# Patient Record
Sex: Male | Born: 1945 | Race: White | Hispanic: No | Marital: Single
Health system: Southern US, Community
[De-identification: ages and names within clinical notes are randomized; demographics above are authoritative.]

---

## 2003-02-19 ENCOUNTER — Encounter (INDEPENDENT_AMBULATORY_CARE_PROVIDER_SITE_OTHER): Payer: Self-pay | Admitting: Specialist

## 2003-02-19 ENCOUNTER — Encounter: Payer: Self-pay | Admitting: Emergency Medicine

## 2003-02-20 ENCOUNTER — Inpatient Hospital Stay (HOSPITAL_COMMUNITY): Admission: EM | Admit: 2003-02-20 | Discharge: 2003-02-27 | Payer: Self-pay | Admitting: Emergency Medicine

## 2003-03-24 ENCOUNTER — Ambulatory Visit (HOSPITAL_COMMUNITY): Admission: RE | Admit: 2003-03-24 | Discharge: 2003-03-24 | Payer: Self-pay | Admitting: Gastroenterology

## 2003-03-24 ENCOUNTER — Encounter (INDEPENDENT_AMBULATORY_CARE_PROVIDER_SITE_OTHER): Payer: Self-pay | Admitting: Specialist

## 2003-09-24 ENCOUNTER — Encounter: Payer: Self-pay | Admitting: Oncology

## 2003-09-24 ENCOUNTER — Ambulatory Visit (HOSPITAL_COMMUNITY): Admission: RE | Admit: 2003-09-24 | Discharge: 2003-09-24 | Payer: Self-pay | Admitting: Oncology

## 2003-10-25 ENCOUNTER — Inpatient Hospital Stay (HOSPITAL_COMMUNITY): Admission: RE | Admit: 2003-10-25 | Discharge: 2003-10-30 | Payer: Self-pay | Admitting: Surgery

## 2003-10-25 ENCOUNTER — Encounter (INDEPENDENT_AMBULATORY_CARE_PROVIDER_SITE_OTHER): Payer: Self-pay

## 2003-11-04 ENCOUNTER — Inpatient Hospital Stay (HOSPITAL_COMMUNITY): Admission: RE | Admit: 2003-11-04 | Discharge: 2003-11-12 | Payer: Self-pay | Admitting: Surgery

## 2003-11-15 ENCOUNTER — Ambulatory Visit (HOSPITAL_COMMUNITY): Admission: RE | Admit: 2003-11-15 | Discharge: 2003-11-15 | Payer: Self-pay | Admitting: Surgery

## 2004-02-28 IMAGING — CT CT PELVIS W/ CM
2 of 13 series · 4 of 32 positions shown, 7 images · IV contrast (omnipaque)
Comparison: none

CLINICAL DATA: Status post sigmoid colon resection 10/25/03.  Now with abdominal pain, fever, diarrhea and elevated white count.
 CT ABDOMEN WITH CONTRAST
 Multi detector helical CT imaging was performed through the abdomen and pelvis following dilute oral contrast and 150 cc Omnipaque 300 IV contrast.  
 There is a tiny low density lesion within the dome of the liver, likely a small cyst although this is too small to characterize.  Otherwise, the liver, spleen, pancreas, adrenals and kidneys are unremarkable.  
 Within the mid and lower abdomen anteriorly, there is a large gas collection.  This extends down into the pelvis and abuts the sigmoid colon within the pelvis.  In the region of the sigmoid colon, there is a small amount of fluid.  The air collection in the abdomen measures 12.6 x 7.6 cm on image #60.  This is most compatible with a postoperative abscess containing mainly gas.  
 IMPRESSION
 Large predominant gas collection within the mid and lower abdomen extending into the pelvis along the sigmoid colon.  There is a small amount of fluid in this predominant air collection seen in the pelvis along the sigmoid colon.  This is most compatible with postoperative abscess.  
 CT PELVIS WITH CONTRAST
 Again noted is the air and fluid collection as previously described above.  Stranding is noted around the sigmoid colon and around this fluid collection inferiorly compatible with abscess.  No free fluid in the pelvis.  
 Stranding along the sigmoid colon.  This large air-fluid collection described above does abut the sigmoid colon, most likely representing a postoperative abscess.

[Series 3: — · axial · 0.74mm/px · z∈[+994,+1024]mm · 2 of 20 slices shown, 5 images (1 of 2)]
[im 7/20  soft-tissue]
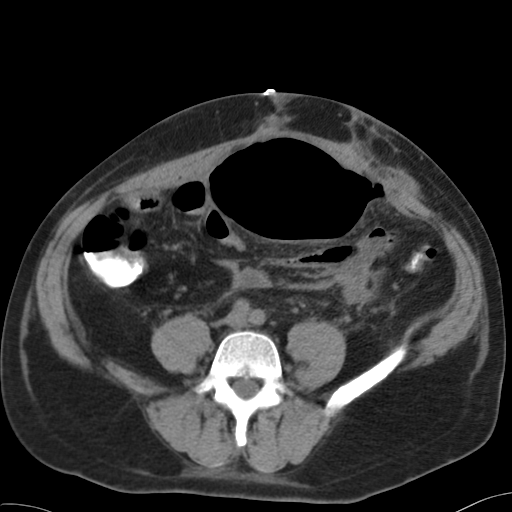
[im 7/20  lung]
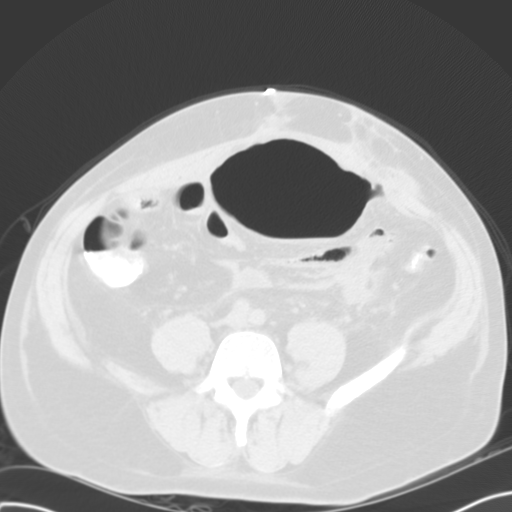
[im 7/20  bone]
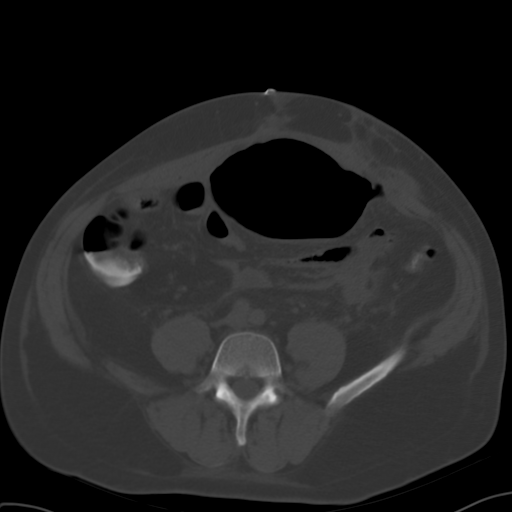
[im 13/20  soft-tissue]
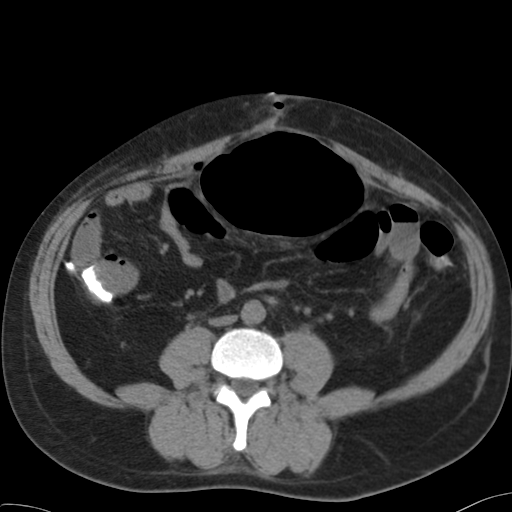
[im 13/20  lung]
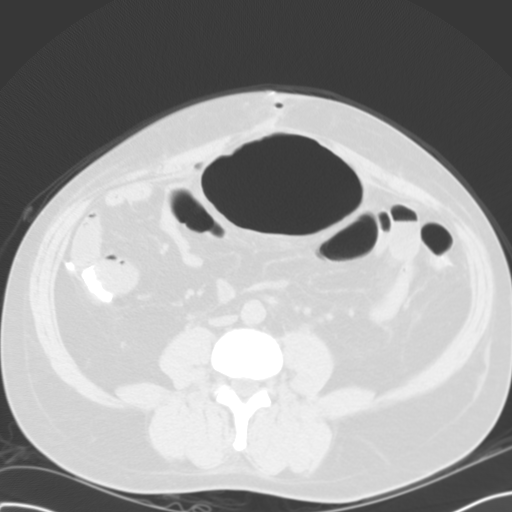

[Series 4: — · axial · 0.74mm/px · z∈[+932,+956]mm · 2 of 17 slices shown (2 of 2)]
[im 6/17  soft-tissue]
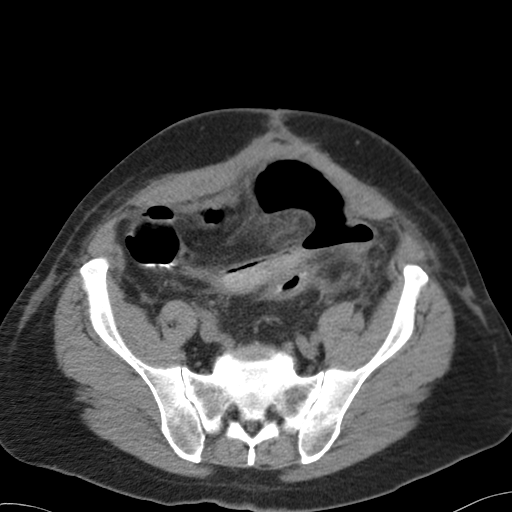
[im 11/17  soft-tissue]
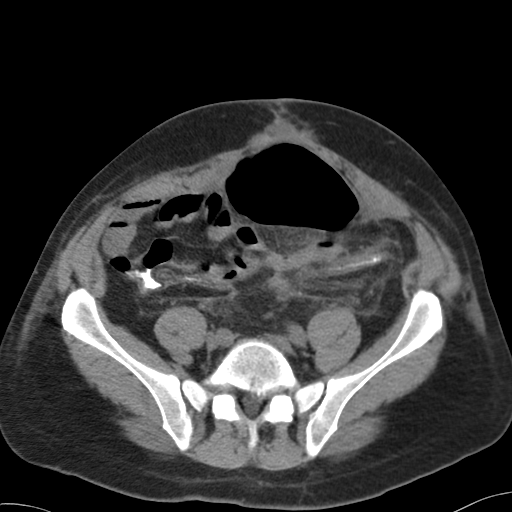

[4 of 32 positions shown; findings below may reference images not displayed]

## 2004-05-09 ENCOUNTER — Ambulatory Visit (HOSPITAL_COMMUNITY): Admission: RE | Admit: 2004-05-09 | Discharge: 2004-05-09 | Payer: Self-pay | Admitting: Oncology

## 2004-11-27 ENCOUNTER — Ambulatory Visit: Payer: Self-pay | Admitting: Oncology

## 2004-11-29 ENCOUNTER — Ambulatory Visit (HOSPITAL_COMMUNITY): Admission: RE | Admit: 2004-11-29 | Discharge: 2004-11-29 | Payer: Self-pay | Admitting: Oncology

## 2005-05-23 ENCOUNTER — Ambulatory Visit: Payer: Self-pay | Admitting: Oncology

## 2005-11-20 ENCOUNTER — Ambulatory Visit: Payer: Self-pay | Admitting: Oncology

## 2005-11-27 ENCOUNTER — Ambulatory Visit (HOSPITAL_COMMUNITY): Admission: RE | Admit: 2005-11-27 | Discharge: 2005-11-27 | Payer: Self-pay | Admitting: Oncology

## 2006-05-18 ENCOUNTER — Ambulatory Visit: Payer: Self-pay | Admitting: Oncology

## 2006-09-17 ENCOUNTER — Encounter: Admission: RE | Admit: 2006-09-17 | Discharge: 2006-09-17 | Payer: Self-pay | Admitting: Internal Medicine

## 2006-11-26 ENCOUNTER — Ambulatory Visit: Payer: Self-pay | Admitting: Oncology

## 2007-05-27 ENCOUNTER — Ambulatory Visit: Payer: Self-pay | Admitting: Oncology

## 2008-02-03 ENCOUNTER — Ambulatory Visit: Payer: Self-pay | Admitting: Oncology

## 2008-02-05 LAB — CEA: CEA: 0.7 ng/mL (ref 0.0–5.0)

## 2008-03-24 ENCOUNTER — Emergency Department (HOSPITAL_COMMUNITY): Admission: EM | Admit: 2008-03-24 | Discharge: 2008-03-24 | Payer: Self-pay | Admitting: Emergency Medicine

## 2010-02-16 ENCOUNTER — Inpatient Hospital Stay (HOSPITAL_COMMUNITY): Admission: EM | Admit: 2010-02-16 | Discharge: 2010-02-19 | Payer: Self-pay | Admitting: Emergency Medicine

## 2010-02-16 ENCOUNTER — Ambulatory Visit: Payer: Self-pay | Admitting: Internal Medicine

## 2010-02-17 ENCOUNTER — Encounter (INDEPENDENT_AMBULATORY_CARE_PROVIDER_SITE_OTHER): Payer: Self-pay | Admitting: Internal Medicine

## 2011-03-14 LAB — BASIC METABOLIC PANEL
BUN: 17 mg/dL (ref 6–23)
BUN: 17 mg/dL (ref 6–23)
BUN: 46 mg/dL — ABNORMAL HIGH (ref 6–23)
CO2: 22 mEq/L (ref 19–32)
CO2: 23 mEq/L (ref 19–32)
CO2: 29 mEq/L (ref 19–32)
Calcium: 7.9 mg/dL — ABNORMAL LOW (ref 8.4–10.5)
Chloride: 101 mEq/L (ref 96–112)
Chloride: 104 mEq/L (ref 96–112)
Chloride: 94 mEq/L — ABNORMAL LOW (ref 96–112)
Chloride: 99 mEq/L (ref 96–112)
Creatinine, Ser: 0.93 mg/dL (ref 0.4–1.5)
Creatinine, Ser: 2.27 mg/dL — ABNORMAL HIGH (ref 0.4–1.5)
GFR calc Af Amer: 36 mL/min — ABNORMAL LOW (ref >60)
GFR calc Af Amer: >60 mL/min (ref >60)
GFR calc non Af Amer: 29 mL/min — ABNORMAL LOW (ref >60)
Glucose, Bld: 107 mg/dL — ABNORMAL HIGH (ref 70–99)
Glucose, Bld: 232 mg/dL — ABNORMAL HIGH (ref 70–99)
Potassium: 3.3 mEq/L — ABNORMAL LOW (ref 3.5–5.1)
Potassium: 3.4 mEq/L — ABNORMAL LOW (ref 3.5–5.1)
Sodium: 129 mEq/L — ABNORMAL LOW (ref 135–145)
Sodium: 135 mEq/L (ref 135–145)

## 2011-03-14 LAB — CBC
HCT: 37.5 % — ABNORMAL LOW (ref 39.0–52.0)
HCT: 40.6 % (ref 39.0–52.0)
Hemoglobin: 12.5 g/dL — ABNORMAL LOW (ref 13.0–17.0)
Hemoglobin: 12.8 g/dL — ABNORMAL LOW (ref 13.0–17.0)
Hemoglobin: 14 g/dL (ref 13.0–17.0)
MCHC: 34.5 g/dL (ref 30.0–36.0)
MCHC: 34.7 g/dL (ref 30.0–36.0)
MCV: 91.6 fL (ref 78.0–100.0)
MCV: 92.2 fL (ref 78.0–100.0)
MCV: 92.5 fL (ref 78.0–100.0)
Platelets: 218 10*3/uL (ref 150–400)
Platelets: 224 10*3/uL (ref 150–400)
RBC: 4 MIL/uL — ABNORMAL LOW (ref 4.22–5.81)
RBC: 4.39 MIL/uL (ref 4.22–5.81)
RDW: 13.8 % (ref 11.5–15.5)
WBC: 10.6 10*3/uL — ABNORMAL HIGH (ref 4.0–10.5)
WBC: 13.2 10*3/uL — ABNORMAL HIGH (ref 4.0–10.5)

## 2011-03-14 LAB — DIFFERENTIAL
Basophils Absolute: 0 10*3/uL (ref 0.0–0.1)
Basophils Relative: 0 % (ref 0–1)
Eosinophils Absolute: 0 10*3/uL (ref 0.0–0.7)
Eosinophils Relative: 0 % (ref 0–5)
Lymphocytes Relative: 3 % — ABNORMAL LOW (ref 12–46)
Lymphs Abs: 0.3 10*3/uL — ABNORMAL LOW (ref 0.7–4.0)
Monocytes Absolute: 0.2 10*3/uL (ref 0.1–1.0)
Monocytes Relative: 2 % — ABNORMAL LOW (ref 3–12)
Neutro Abs: 12.6 10*3/uL — ABNORMAL HIGH (ref 1.7–7.7)
Neutrophils Relative %: 96 % — ABNORMAL HIGH (ref 43–77)

## 2011-03-14 LAB — CULTURE, BLOOD (ROUTINE X 2): Culture: NO GROWTH

## 2011-03-14 LAB — TSH: TSH: 0.047 u[IU]/mL — ABNORMAL LOW (ref 0.350–4.500)

## 2011-03-14 LAB — GLUCOSE, CAPILLARY: Glucose-Capillary: 192 mg/dL — ABNORMAL HIGH (ref 70–99)

## 2011-05-11 NOTE — Discharge Summary (Signed)
NAME:  Garrett Parker, Garrett Parker                         ACCOUNT NO.:  [id number]   MEDICAL RECORD NO.:  [id number]                   PATIENT TYPE:  INP   LOCATION:  0472                                 FACILITY:  [facility]   PHYSICIAN:  Currie Paris, M.D.           DATE OF BIRTH:  March 16, 1946   DATE OF ADMISSION:  11/04/2003  DATE OF DISCHARGE:  11/12/2003                                 DISCHARGE SUMMARY   FINAL DIAGNOSES:  1. Pelvic abscess status post takedown of sigmoid colostomy.  2. Malnutrition.   HISTORY:  Mr. Franchi is a 65 year old gentleman recently discharged from  this hospital following takedown of his sigmoid colon colostomy.  He  underwent that surgery on November 1 and was discharged on October 30, 2003  doing well.  He presented back to the office on November 11 for follow-up  and noted that he had been having some bloating and fevers at home up to  102.  A white count was checked and was elevated and a CT scan obtained  showing a fairly large pocket of air with a small fluid collection by the  rectum suggesting that he had a limited leak postoperatively.   HOSPITAL COURSE:  The patient was admitted and begun on IV antibiotics.  Because he really had had very little nutrition a PICC line was placed both  for antibiotics and for TNA.  On November 12 he was taken back to radiology  where his left lower quadrant abscess was drained with CT guidance and he  tolerated that very well.  He felt much better following the drainage  although had actually not felt particularly sick prior to that.  On November  12 his temperature had come down to 100.5; his white count was 16,000.  He  was having still some abdominal distention, was kept n.p.o. and on IV  antibiotics and the TNA.  The next day he was noted to have decreased  drainage.  His pulse had slowed down and he appeared to be feeling better by  November 13, and by November 14 in fact abdomen had decreased markedly from  the  day before, was passing gas and has a lot less discomfort.  The drain  was causing some localized pain but otherwise was working and his white  count had come down to 10,000.  On November 14 he was noted to be feeling  better.  He was hungry, no nausea, his bowels were working, and minimal  drainage so we started him on clear liquids.  He was rescanned on Wednesday,  November 17 with a marked improvement in his fluid collection although there  are a couple little air-fluid levels in the abdominal wall of uncertain  significance.  There was still a little residual fluid and we allowed the  drain to stay in place.  Repeat laboratories showed continued improvement  with his white count and on November 18 we switched him  from IV to p.o.  antibiotics and stopped his TNA.  His white count was still slightly  elevated on that day at 12,000 so we kept him one more day.  On November 19  he was having no abdominal pain, was afebrile, felt fine, his abdomen was  benign, and he was able to be discharged home.  We plan to flush his PICC  and left lower quadrant drain and follow-up in the office with a repeat CT  scan several days later.  He was sent home on Cipro and Flagyl as well as  Vicodin for pain.   Initial white count done this admission was 16,000; had come down to 10 and  then back up slightly to 12.  Initial hemoglobin had been noted at 11 but  stabilized out around 13.  CMETs were basically unremarkable.  Cholesterol  was low at 103.  Prealbumins were low at 6.8, up to 24.9 just prior to  discharge.  He had multiple organisms noted in his culture with some gram  negative rods, gram positive cocci, and some gram positive rods as well.                                               Currie Paris, M.D.   CJS/MEDQ  D:  11/22/2003  T:  11/22/2003  Job:  [id number]

## 2011-05-11 NOTE — H&P (Signed)
NAME:  Garrett Parker, Garrett Parker                         ACCOUNT NO.:  [id number]   MEDICAL RECORD NO.:  [id number]                   PATIENT TYPE:  INP   LOCATION:  0474                                 FACILITY:  [facility]   PHYSICIAN:  Sandria Bales. Ezzard Standing, M.D.               DATE OF BIRTH:  17-Jun-1946   DATE OF ADMISSION:  11/04/2003  DATE OF DISCHARGE:                                HISTORY & PHYSICAL   HISTORY OF PRESENT ILLNESS:  This is A 65 year old white male who has no  primary physician but has been followed by Dr. Mancel Bale. His story goes  back that in February of 2004, he presented with what at first was thought  to be a perforated diverticular disease when it was found out this was  actually a perforated sigmoid colon carcinoma. I do not have the path report  in front of me but the tumor is reported about 4 cm in size and he had 1 of  4 nodes positive.   He then had chemotherapy supervised by Dr. Betsy Pries, has been turned over  to Dr. Mancel Bale for long-term followup care. He also underwent a  negative colonoscopy by Dr. Dorena Cookey on March 24, 2003. He had his  colostomy reversed by Dr. Jamey Ripa at [facility] on October 25, 2003, did well postop but then starting running fevers at home and has  actual increasing abdominal distention and tenderness.   Dr. Jamey Ripa saw him in the office today. He had a white blood count which was  checked which was 18,000. A CT scan obtained tonight showed a large pocket  of air with a small amount of fluid adjacent to his anastomosis. I reviewed  this with Dr. Charlett Nose consistent with a probable limited leak in his  anastomosis.   PAST MEDICAL HISTORY:  He is allergic to PENICILLIN which leads to rash. He  denies significant pulmonary, cardiac, gastrointestinal or urologic  problems.   MEDICATIONS:  1. Prozac 40 mg in the morning.  2. Trazodone 150 mg at night.   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   PHYSICAL EXAMINATION:  VITAL SIGNS:  His temperature is 102, pulse 100,  respirations about 20 and regular.  HEENT:  Unremarkable.  NECK:  Supple, I feel no mass.  LUNGS:  Clear to auscultation.  HEART:  Tachycardic without murmur.  ABDOMEN:  Distended. He has a few staples left in his wound but his abdomen  otherwise looks good. He is tender in his lower abdomen.   A white blood count which I do not have but by report is 18,000. A CT scan I  have already reviewed with Dr. Kearney Hard showed a large pocket of air with a  small amount of fluid which is adjacent to what appears to be his  anastomosis of the sigmoid  colon.   IMPRESSION:  1. Mr. Mandich has a postoperative leak from his anastomosis which appears to     be limited and walled off at this time. I think it is reasonable to     approach this in a conservative manner.  Plan to keep him n.p.o., place     him on IV antibiotics, IV fluids. Plan percutaneous drainage of this     abscess/air cavity tomorrow with placement of a PICC line for total     parenteral nutrition and then follow him from there. The patient is not     happy about being in the hospital, he had a bad experience last time but     I tried to reinforce that it is going to be necessary for     hospitalization. There is actually a chance that he would need surgery,     either as primary resection of the his leak or a diverting colostomy     proximally.  2. Prior sigmoid colon carcinoma, disease free at this time.                                               Sandria Bales. Ezzard Standing, M.D.    DHN/MEDQ  D:  11/04/2003  T:  11/04/2003  Job:  [id number]   cc:   Jillyn Hidden B. Truett Perna, M.D.  [address] [facility]  [place]  [place] [address]  Fax: [phone]   Everardo All. Madilyn Fireman, M.D.  [address]  [place]  [place] [address]  Fax: [phone]   Currie Paris, M.D.  [address], [address]  [place]  [place] [address]  Fax: [phone]

## 2011-05-11 NOTE — Op Note (Signed)
   NAME:  Garrett Parker, Garrett Parker                         ACCOUNT NO.:  [id number]   MEDICAL RECORD NO.:  [id number]                   PATIENT TYPE:  AMB   LOCATION:  ENDO                                 FACILITY:  [facility]   PHYSICIAN:  John C. Madilyn Fireman, M.D.                 DATE OF BIRTH:  July 31, 1946   DATE OF PROCEDURE:  03/24/2003  DATE OF DISCHARGE:                                 OPERATIVE REPORT   PROCEDURE:  Colonoscopy.   INDICATIONS FOR PROCEDURE:  Patient with recent perforated diverticulitis  and found to have a colon cancer with one positive lymph node.  He is status  post sigmoid resection, colostomy, and Hartmann's pouch, and is scheduled to  undergo chemotherapy within the next two weeks.  Procedure is to assess  colon for any other neoplasm.   DESCRIPTION OF PROCEDURE:  The patient was placed in the left lateral  decubitus position and placed on the pulse monitor with continuous low-flow  oxygen delivered by nasal cannula.  He was sedated with 75 mcg IV fentanyl  and 8 mg IV Versed.  The Olympus video colonoscope was inserted into the  rectum and advanced to the end of the Hartmann's pouch, just about 30 cm  from the anal verge.  There was some adherent, viscus-brown liquid on the  walls, and I could not see fine detail of the mucosa but saw no obvious  raised lesions to suggest a polyp, and no masses or diverticula were seen.  The scope was withdrawn and reinserted into the ostomy site and advanced  easily the cecum.  The prep was excellent.  The cecum, ascending, and  remaining transverse colon appeared normal with no masses, polyps,  diverticula, or other mucosal abnormalities.  At about 30 cm from the  ostomy, there was a small, 6 mm polyp which was fulgurated by hot biopsy.  The remainder of the mucosa appeared normal down to the stoma, and I did not  see any diverticula.  The scope was then withdrawn and the patient returned  to the recovery room in stable condition.  He  tolerated the procedure well,  and there were no immediate complications.   IMPRESSION:  1. Tiny polyp estimated to be in the descending or transverse colon.  2. Otherwise, normal colon and Hartmann's pouch.                                               John C. Madilyn Fireman, M.D.    JCH/MEDQ  D:  03/24/2003  T:  03/24/2003  Job:  [id number]   cc:   Anselm Pancoast. Zachery Dakins, M.D.  [address], [address]  [place]  [place] [address]  Fax: [phone]

## 2011-05-11 NOTE — Discharge Summary (Signed)
NAME:  Garrett, Parker                         ACCOUNT NO.:  [id number]   MEDICAL RECORD NO.:  [id number]                   PATIENT TYPE:  INP   LOCATION:  0473                                 FACILITY:  [facility]   PHYSICIAN:  Currie Paris, M.D.           DATE OF BIRTH:  1946/08/16   DATE OF ADMISSION:  10/25/2003  DATE OF DISCHARGE:  10/30/2003                                 DISCHARGE SUMMARY   VISIT NUMBER:  [id number]   FINAL DIAGNOSES:  1. Colostomy, status post Luz Brazen procedure for perforated colon cancer.  2. Atelectasis.   CLINICAL HISTORY:  [name] is a 65 year old gentleman who is admitted  electively for take down of his end colostomy and reanastomosis.   HOSPITAL COURSE:  The patient was admitted and taken to the operating room  where his colostomy was taken down.  He has multiple adhesions from prior  surgery which also had be taken down, but he tolerated the procedure well.  Postoperatively, he seemed to be comfortable.  He developed bowel sounds  fairly early, his lungs remained clear, and we discontinued his NG on  October 27, 2003, tried him on some Reglan, and at his request took his  Foley out.  He did have a little distention at first, but by the third  postoperative day was passing lots of gas, having no nausea, had a low-grade  fever of 101, his abdomen was soft, and his wound appeared to be okay.  His  white count did jump up to 20,000, and we thought this might be from  pulmonary.  Checked a chest x-ray.  Started him on clear liquids and got him  on some Vicodin.  On the fourth postoperative day he was hungry, tolerating  clear liquids, passing gas, and wanted to switch to oral pain medications.  He is alert and comfortable.  His white count had come down to 13,000.  His  chest x-ray showed some atelectasis.  We thought he was improving, and  increased his diet and restarted his medications.  On October 30, 2003, he  was feeling great and wanted to go  home.  His bowels were working.  His  belly felt benign.  He was discharged on ibuprofen as needed, to resume home  medications, Vicodin, Tylox as needed as well.  He is to follow up in about  five days in the office.                                               Currie Paris, M.D.    CJS/MEDQ  D:  11/22/2003  T:  11/22/2003  Job:  [id number]

## 2011-05-11 NOTE — Op Note (Signed)
NAME:  Garrett Parker, Garrett Parker                         ACCOUNT NO.:  [id number]   MEDICAL RECORD NO.:  [id number]                   PATIENT TYPE:  INP   LOCATION:  0006                                 FACILITY:  [facility]   PHYSICIAN:  Currie Paris, M.D.           DATE OF BIRTH:  07-16-1946   DATE OF PROCEDURE:  10/25/2003  DATE OF DISCHARGE:                                 OPERATIVE REPORT   PREOPERATIVE DIAGNOSES:  1. End colostomy and Hartmann's, status post sigmoid colon resection.  2. Extensive adhesions.   POSTOPERATIVE DIAGNOSES:  1. End colostomy and Hartmann's, status post sigmoid colon resection.  2. Extensive adhesions.   OPERATION:  Laparotomy with lysis of adhesions (40 minutes operating time).  Takedown colostomy with partial sigmoid resection. Primary colorectal  anastomosis.   SURGEON:  Currie Paris, M.D.   ASSISTANT:  Gabrielle Dare. Janee Morn, M.D.   ANESTHESIA:  General endotracheal.   CLINICAL HISTORY:  This patient is a 65 year old gentleman, who presented  way back in February with a perforated colon which turned out to be from a  perforated carcinoma.  He was subsequently treated and now ready to have his  anastomosis taken down.  He had a proximal CT scan, showed no evidence of  current disease plus colonoscopy showing no evidence of colon problems of  significance.   DESCRIPTION OF PROCEDURE:  The patient seen in the holding area and had no  further questions.  He was taken to the operating room.  After satisfactory  general endotracheal anesthesia had been obtained, the colostomy was suture  closed with a pursestring suture of 2-0 chromic.  The abdomen was shaved,  prepped, and draped with a catheter being placed in the bladder.  The old  midline scar was excised, the incision extended superiorly for a couple of  inches above the umbilicus.  I was able to enter the peritoneal cavity above  here safely, but the omentum was stuck diffusely across the  entire upper  abdomen, down into the lower abdomen throughout for the entire width of the  abdominal cavity from about the level of the colostomy on the left to the  level of the colostomy on the right from side-to-side and down to the  bladder so it basically was stuck over the abdominal wall.  This had to be  bluntly and sharply and cauterized down, freed up.  Portions of it had to be  resected and tied off for bleeding purposes or because they were necrotic  and just fragments of omentum.  Once this was done, I then found multiple  more adhesions of small bowel into the pelvis stuck to the Hartmann's  closure, stuck to the colostomy site with omental adhesions down here as  well.  These were all carefully lysed.  Once all this was done, we stopped  here, gaited and ran the bowel to make sure there were no injuries to the  small bowel and that we had the colon freed up.  Once this was done, I  placed a self-retaining retractor in and then freed up the rectum for a  distance of a couple of centimeters to make sure that we had an adequate  area for anastomosis, cleaned off the mesentery so that I had a nice site  for the anastomosis.  Of note is the fact that the lysis of adhesions  portion took 40 minutes from initial entry into the abdomen until we were  able to actually start working on the colon for the takedown of the  colostomy.   At this point, I turned my attention over to the left mid abdomen and took  down the colostomy, dividing some of the adhesions where it was stuck up and  freeing around it.  I then fired the GIA across and dropped it back into the  peritoneal cavity.  We had a fairly good length, and I decided to resect out  to 3-4 inches at the distal where it was a little beat up, and I was not  sure of the blood supply.  I divided the mesentery up to a place where we  were able to clean off the colon circumferentially.  I had a wound protector  in as well.  The colon  was divided here, crossed with a Kocher and an [employer]  clamp.  Stay sutures were placed on either side of the rectum, and the  rectal stump was divided with a knife using a Satinsky as a cutting guide.  The two ends overlapped by several inches, so I thought there would be no  tension whatsoever.   An end-to-end anastomosis was done with 3-0 silk sutures going through-and-  through for the back row and then inverting and ducking the front row as we  went with the last two sutures being some Gambee sutures to complete the  anastomosis.  Again, it seemed to have no tension, good blood supply as it  bled while we were working and palpated to about two fingers at the  completion of the anastomosis.  I put a couple of sutures in to tack the  mesentery down to the base.  At this point, we changed gloves and removed  the wound protector and B[place]our, irrigated for several minutes and made sure  everything was dry.  I brought the omentum down over again so it would cover  the abdomen.  I then laid a piece of Seprafilm in to see if we could reduce  any further adhesions.   The abdomen was then closed with a running #1 Novofil in the fascia.  I did  have to free some scar tissue off of the fascia to get good visualization of  this.  The wound was irrigated and checked for hemostasis and closed with  staples.   Attention was turned to the colostomy site and an elliptical incision made  around the colostomy and using Allis clamps and elevation and cautery, I was  able to incise this from where it was stuck to the subcu and to the fascia.  The fascia was then closed with interrupted #1 Novofils.  The wound was  irrigated and closed with staples.   The patient tolerated the procedure well.  There were no operative  complications.  All counts were correct.  Currie Paris, M.D.    CJS/MEDQ  D:  10/25/2003  T:  10/25/2003  Job:  [id number]

## 2011-05-11 NOTE — H&P (Signed)
Garrett Parker                          ACCOUNT NO.:  [id number]   MEDICAL RECORD NO.:  [id number]                   PATIENT TYPE:   LOCATION:                                       FACILITY:   PHYSICIAN:  Anselm Pancoast. Zachery Dakins, M.D.          DATE OF BIRTH:   DATE OF ADMISSION:  02/19/2003  DATE OF DISCHARGE:                                HISTORY & PHYSICAL   CHIEF COMPLAINT:  Severe lower abdominal pain.   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  He is a friend of Dr. [name], and he was given some pain  medication and other treatment for the last couple of days.  Because of the  increase in pain, he was advised to definitely come to the emergency room  today, and he arrived late this evening.  He said for the last several weeks  he has been having problems with basically diarrhea, difficulty having a  bowel movement.  He has continued to work, and he has not had any type of  colon evaluation.  In the emergency room, he was definitely tender in the  lower abdomen.  He was seen by Dr. Beverely Pace.  He had an elevated temperature,  elevated pulse, white count of about 19,800, and he was definitely tender in  the lower abdomen, possibly more so on the right, but not really localized  to the right or left lower quadrant.  Ordered a CT.  He was there and  actually checking it before leaving, and they advised that I see the  patient, and I went ahead and placed him on Cipro and thought this was  either a ruptured appendicitis or a bad diverticulitis because of the  findings of lower left pain.  He really had never had the prodromals, nausea  and vomiting like a typical appendicitis.  A CT was performed, and it showed  a lot of air in the retroperitoneum and originating from a very swollen loop  of sigmoid colon.  The appendix looked unremarkable, and this was  obviously  a sigmoid diverticulitis with perforation and pelvic abscess.  I talked with  the patient that we needed to perform exploratory laparotomy, and he would  need a colostomy and a colectomy, and he gave permission.   PAST HISTORY:  I do not think he has ever been married.  Has no children.  He lists his sister as his next of kin.   CHRONIC MEDICATIONS:  I do not think he is on any.   HABITS:  He does not smoke.   PHYSICAL EXAMINATION:  GENERAL:  He is an overweight male, stocky, and in  pretty significant abdominal pain.  VITAL SIGNS:  Temperature was 101.4, pulse 104, respirations 18, blood  pressure 136/68.  HEENT:  Appears adequately hydrated.  Good breath  sounds bilaterally.  Normal sinus rhythm or sinus tachycardia.  ABDOMEN:  Basically no bowel sounds.  Definitely tender in the lower  abdomen, right slightly more so than the left.  RECTAL:  Could not feel anything.  GENITALIA:  Unremarkable.  EXTREMITIES:  No pedal edema.   ADMISSION IMPRESSION:  Acute sigmoid diverticulitis with pelvic abscess and  perforation.    PLAN:  Exploratory laparotomy.  We have started him on Cipro.  I will add  Flagyl.  He is scheduled to go to the emergency room promptly.                                               Anselm Pancoast. Zachery Dakins, M.D.    WJW/MEDQ  D:  02/20/2003  T:  02/20/2003  Job:  [id number]

## 2011-05-11 NOTE — Discharge Summary (Signed)
NAME:  Garrett Parker, Garrett Parker                         ACCOUNT NO.:  [id number]   MEDICAL RECORD NO.:  [id number]                   PATIENT TYPE:  INP   LOCATION:  0464                                 FACILITY:  [facility]   PHYSICIAN:  Anselm Pancoast. Zachery Dakins, M.D.          DATE OF BIRTH:  July 03, 1946   DATE OF ADMISSION:  02/19/2003  DATE OF DISCHARGE:  02/27/2003                                 DISCHARGE SUMMARY   DISCHARGE DIAGNOSES:  1. Perforated sigmoid diverticulitis with pelvic abscess.  2. 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   OPERATION:  Exploratory laparotomy with sigmoid colectomy, end colostomy of  Hartmann.   HISTORY OF PRESENT ILLNESS:  The patient is a 65 year old Caucasian male who  presented to the emergency room on February 19, 2003, after for  approximately three to four days he has had progressive lower abdominal  pain.  He is a friend of [name], who had given him some pain medication for  the last several days.  Because of the increase in pain he was advised to  come to the emergency room, and he arrived late this evening.  He stated for  several weeks he has been having problems with basically diarrhea, having  difficulty having bowel movements.  He continued to work.  Had not had any  type of colon evaluation.  He did admit to having some blood in his stools.  He works in [employer] work, and he thought this was probably secondary to  hemorrhoids.  He was seen by Dr. Beverely Pace, had an elevated temperature,  elevated pulse, white count of about 19,800, and was definitely tender  diffusely in the lower abdomen, more so on the right, but not well  localized.  He ordered a CT, and I checked in the ER before leaving, and he  informed me that there was a patient who probably had a ruptured  appendicitis, and I saw him and was definitely impressed that he was very  tender, needed surgery, but since he had already had the contrast, proceed  on with the  CT, which was basically being obtained at that time.  On the  findings he was noted to have a significant sigmoid diverticulitis with a  pelvic abscess, and there was definitely air within the abscess, and he has  had a chronic contained perforation.  I discussed with the patient that he  needed surgery.  He would need a colostomy and a colectomy, and he gave  permission.  The patient is single and stated that his health otherwise had  been good.   HOSPITAL COURSE:  He is allergic to PENICILLIN, and I placed him on Cipro  and added Flagyl and took him on over to the emergency room yesterday at  approximately 1 a.m.  Surgery was assisted by Dr. Frederik Schmidt, and we found a  significantly inflamed long segment of sigmoid colon with diffuse  diverticulitis, and there was a large abscess kind of within the mesentery  of the colon.  This was opened, and all the loculations were broken up,  about a 12- to 15-inch segment of sigmoid colon removed, and a Hartmann  closure of the distal sigmoid right above the peritoneal reflection, and a  colostomy brought up through the left rectus.  He was continued on Flagyl  and IV Cipro and started having colostomy function in approximately two  days.  He was very distended at the time of surgery, and he is also  overweight.  Dr. __________  , the pathologist, called me about two days  later and said that within the specimen there was diffuse diverticulitis  with perforation and what looked like abscesses.  There was a 4 cm  carcinoma, and one of the four lymph nodes that he identified did have  metastatic disease.  I discussed this with the patient and feel that we will  need an oncology consultation and that he should complete adjuvant  chemotherapy before consideration of colostomy takedown.  The CT at the time  of preoperative evaluation had shown no evidence of any metastatic disease  of the liver, and I could find no evidence of metastatic disease at the  time  of laparotomy.  The patient's wound appeared to be healing satisfactory.  His white count, however, was very slow in returning.  The initial white  count was 19,800, and postoperatively the white count was 19,000 24 hours  later, 18,700, then 16,400, and on February 27, 2003, at the time of discharge  was 15,300.  He was afebrile, however, appeared to be doing well, and I  discharged him on Augmentin 875 b.i.d. for an additional week and instructed  him to return for a CBC prior to being seen in my office in approximately  three days.  He understands that if he has fever or increase in pain that  the possibility of abdominal abscess is certainly significant with this  massive diverticulitis and multiloculated abscesses at the time of surgery.  I had placed a JP drain in at the time of surgery, and this was removed.  At  no time did he have any frank purulence or stool contents coming from the  drain.  The patient was discharged.  He had Tylox for pain in addition to  the Augmentin and will be followed in the office in approximately five days.  We will arrange for a medical oncologist consultation.  I will also arrange  for a colonoscopy of the remaining rectum and proximal colon, and the  patient is aware that this is a malignancy that was not the cause of the  obstruction but definitely has probably more serious long-range potential  than the diverticulitis.                                               Anselm Pancoast. Zachery Dakins, M.D.    WJW/MEDQ  D:  03/16/2003  T:  03/16/2003  Job:  [id number]

## 2012-11-03 DIAGNOSIS — D751 Secondary polycythemia: Secondary | ICD-10-CM | POA: Diagnosis not present

## 2012-11-03 DIAGNOSIS — Z79899 Other long term (current) drug therapy: Secondary | ICD-10-CM | POA: Diagnosis not present

## 2012-11-03 DIAGNOSIS — C9421 Acute megakaryoblastic leukemia, in remission: Secondary | ICD-10-CM | POA: Diagnosis not present

## 2012-11-03 DIAGNOSIS — E291 Testicular hypofunction: Secondary | ICD-10-CM | POA: Diagnosis not present

## 2012-11-03 DIAGNOSIS — N138 Other obstructive and reflux uropathy: Secondary | ICD-10-CM | POA: Diagnosis not present

## 2012-11-03 DIAGNOSIS — N401 Enlarged prostate with lower urinary tract symptoms: Secondary | ICD-10-CM | POA: Diagnosis not present

## 2012-11-03 DIAGNOSIS — N529 Male erectile dysfunction, unspecified: Secondary | ICD-10-CM | POA: Diagnosis not present

## 2012-11-25 DIAGNOSIS — H4011X0 Primary open-angle glaucoma, stage unspecified: Secondary | ICD-10-CM | POA: Diagnosis not present

## 2013-01-27 DIAGNOSIS — H4011X0 Primary open-angle glaucoma, stage unspecified: Secondary | ICD-10-CM | POA: Diagnosis not present

## 2013-04-28 DIAGNOSIS — H4011X0 Primary open-angle glaucoma, stage unspecified: Secondary | ICD-10-CM | POA: Diagnosis not present

## 2013-05-27 DIAGNOSIS — E291 Testicular hypofunction: Secondary | ICD-10-CM | POA: Diagnosis not present

## 2013-05-27 DIAGNOSIS — E785 Hyperlipidemia, unspecified: Secondary | ICD-10-CM | POA: Diagnosis not present

## 2013-05-27 DIAGNOSIS — Z125 Encounter for screening for malignant neoplasm of prostate: Secondary | ICD-10-CM | POA: Diagnosis not present

## 2013-05-27 DIAGNOSIS — Z0000 Encounter for general adult medical examination without abnormal findings: Secondary | ICD-10-CM | POA: Diagnosis not present

## 2013-05-27 DIAGNOSIS — I10 Essential (primary) hypertension: Secondary | ICD-10-CM | POA: Diagnosis not present

## 2013-06-29 DIAGNOSIS — H02409 Unspecified ptosis of unspecified eyelid: Secondary | ICD-10-CM | POA: Diagnosis not present

## 2013-06-29 DIAGNOSIS — H02839 Dermatochalasis of unspecified eye, unspecified eyelid: Secondary | ICD-10-CM | POA: Diagnosis not present

## 2013-06-29 DIAGNOSIS — H02429 Myogenic ptosis of unspecified eyelid: Secondary | ICD-10-CM | POA: Diagnosis not present

## 2013-06-29 DIAGNOSIS — H5340 Unspecified visual field defects: Secondary | ICD-10-CM | POA: Diagnosis not present

## 2013-07-10 DIAGNOSIS — H2510 Age-related nuclear cataract, unspecified eye: Secondary | ICD-10-CM | POA: Diagnosis not present

## 2013-07-10 DIAGNOSIS — H25019 Cortical age-related cataract, unspecified eye: Secondary | ICD-10-CM | POA: Diagnosis not present

## 2013-07-10 DIAGNOSIS — H18419 Arcus senilis, unspecified eye: Secondary | ICD-10-CM | POA: Diagnosis not present

## 2013-07-10 DIAGNOSIS — H4010X0 Unspecified open-angle glaucoma, stage unspecified: Secondary | ICD-10-CM | POA: Diagnosis not present

## 2013-07-10 DIAGNOSIS — H25049 Posterior subcapsular polar age-related cataract, unspecified eye: Secondary | ICD-10-CM | POA: Diagnosis not present

## 2013-09-07 DIAGNOSIS — H269 Unspecified cataract: Secondary | ICD-10-CM | POA: Diagnosis not present

## 2013-09-07 DIAGNOSIS — H2510 Age-related nuclear cataract, unspecified eye: Secondary | ICD-10-CM | POA: Diagnosis not present

## 2013-09-08 DIAGNOSIS — H2510 Age-related nuclear cataract, unspecified eye: Secondary | ICD-10-CM | POA: Diagnosis not present

## 2013-09-14 DIAGNOSIS — H2510 Age-related nuclear cataract, unspecified eye: Secondary | ICD-10-CM | POA: Diagnosis not present

## 2013-09-28 DIAGNOSIS — H2510 Age-related nuclear cataract, unspecified eye: Secondary | ICD-10-CM | POA: Diagnosis not present

## 2013-09-28 DIAGNOSIS — H269 Unspecified cataract: Secondary | ICD-10-CM | POA: Diagnosis not present

## 2013-10-14 DIAGNOSIS — H5340 Unspecified visual field defects: Secondary | ICD-10-CM | POA: Diagnosis not present

## 2013-10-14 DIAGNOSIS — H02409 Unspecified ptosis of unspecified eyelid: Secondary | ICD-10-CM | POA: Diagnosis not present

## 2013-10-14 DIAGNOSIS — H02839 Dermatochalasis of unspecified eye, unspecified eyelid: Secondary | ICD-10-CM | POA: Diagnosis not present

## 2013-10-14 DIAGNOSIS — H02429 Myogenic ptosis of unspecified eyelid: Secondary | ICD-10-CM | POA: Diagnosis not present

## 2013-10-20 DIAGNOSIS — I10 Essential (primary) hypertension: Secondary | ICD-10-CM | POA: Diagnosis not present

## 2013-10-20 DIAGNOSIS — Z23 Encounter for immunization: Secondary | ICD-10-CM | POA: Diagnosis not present

## 2013-10-20 DIAGNOSIS — J329 Chronic sinusitis, unspecified: Secondary | ICD-10-CM | POA: Diagnosis not present

## 2013-12-04 DIAGNOSIS — E669 Obesity, unspecified: Secondary | ICD-10-CM | POA: Diagnosis not present

## 2013-12-04 DIAGNOSIS — Z9849 Cataract extraction status, unspecified eye: Secondary | ICD-10-CM | POA: Diagnosis not present

## 2013-12-04 DIAGNOSIS — H02429 Myogenic ptosis of unspecified eyelid: Secondary | ICD-10-CM | POA: Diagnosis not present

## 2013-12-04 DIAGNOSIS — H02409 Unspecified ptosis of unspecified eyelid: Secondary | ICD-10-CM | POA: Diagnosis not present

## 2013-12-04 DIAGNOSIS — Z880 Allergy status to penicillin: Secondary | ICD-10-CM | POA: Diagnosis not present

## 2013-12-04 DIAGNOSIS — H02839 Dermatochalasis of unspecified eye, unspecified eyelid: Secondary | ICD-10-CM | POA: Diagnosis not present

## 2013-12-04 DIAGNOSIS — Z885 Allergy status to narcotic agent status: Secondary | ICD-10-CM | POA: Diagnosis not present

## 2013-12-04 DIAGNOSIS — H5340 Unspecified visual field defects: Secondary | ICD-10-CM | POA: Diagnosis not present

## 2013-12-04 DIAGNOSIS — Z961 Presence of intraocular lens: Secondary | ICD-10-CM | POA: Diagnosis not present

## 2013-12-04 DIAGNOSIS — Z7982 Long term (current) use of aspirin: Secondary | ICD-10-CM | POA: Diagnosis not present

## 2013-12-04 DIAGNOSIS — I10 Essential (primary) hypertension: Secondary | ICD-10-CM | POA: Diagnosis not present

## 2013-12-04 DIAGNOSIS — Z79899 Other long term (current) drug therapy: Secondary | ICD-10-CM | POA: Diagnosis not present

## 2013-12-04 DIAGNOSIS — H409 Unspecified glaucoma: Secondary | ICD-10-CM | POA: Diagnosis not present

## 2014-01-01 DIAGNOSIS — R05 Cough: Secondary | ICD-10-CM | POA: Diagnosis not present

## 2014-01-01 DIAGNOSIS — R059 Cough, unspecified: Secondary | ICD-10-CM | POA: Diagnosis not present

## 2014-01-01 DIAGNOSIS — J040 Acute laryngitis: Secondary | ICD-10-CM | POA: Diagnosis not present

## 2014-02-25 DIAGNOSIS — R05 Cough: Secondary | ICD-10-CM | POA: Diagnosis not present

## 2014-02-25 DIAGNOSIS — R059 Cough, unspecified: Secondary | ICD-10-CM | POA: Diagnosis not present

## 2014-03-11 DIAGNOSIS — H018 Other specified inflammations of eyelid: Secondary | ICD-10-CM | POA: Diagnosis not present

## 2014-03-11 DIAGNOSIS — H4011X0 Primary open-angle glaucoma, stage unspecified: Secondary | ICD-10-CM | POA: Diagnosis not present

## 2014-03-11 DIAGNOSIS — H5210 Myopia, unspecified eye: Secondary | ICD-10-CM | POA: Diagnosis not present

## 2014-03-11 DIAGNOSIS — H35379 Puckering of macula, unspecified eye: Secondary | ICD-10-CM | POA: Diagnosis not present

## 2014-06-01 DIAGNOSIS — I10 Essential (primary) hypertension: Secondary | ICD-10-CM | POA: Diagnosis not present

## 2014-06-01 DIAGNOSIS — E785 Hyperlipidemia, unspecified: Secondary | ICD-10-CM | POA: Diagnosis not present

## 2014-06-01 DIAGNOSIS — Z125 Encounter for screening for malignant neoplasm of prostate: Secondary | ICD-10-CM | POA: Diagnosis not present

## 2014-06-01 DIAGNOSIS — Z23 Encounter for immunization: Secondary | ICD-10-CM | POA: Diagnosis not present

## 2014-06-01 DIAGNOSIS — Z0000 Encounter for general adult medical examination without abnormal findings: Secondary | ICD-10-CM | POA: Diagnosis not present

## 2014-06-01 DIAGNOSIS — E291 Testicular hypofunction: Secondary | ICD-10-CM | POA: Diagnosis not present

## 2014-06-10 DIAGNOSIS — H4011X0 Primary open-angle glaucoma, stage unspecified: Secondary | ICD-10-CM | POA: Diagnosis not present

## 2014-08-10 DIAGNOSIS — I10 Essential (primary) hypertension: Secondary | ICD-10-CM | POA: Diagnosis not present

## 2014-08-10 DIAGNOSIS — Z23 Encounter for immunization: Secondary | ICD-10-CM | POA: Diagnosis not present

## 2014-08-10 DIAGNOSIS — Z125 Encounter for screening for malignant neoplasm of prostate: Secondary | ICD-10-CM | POA: Diagnosis not present

## 2014-08-10 DIAGNOSIS — Z0000 Encounter for general adult medical examination without abnormal findings: Secondary | ICD-10-CM | POA: Diagnosis not present

## 2014-08-10 DIAGNOSIS — E291 Testicular hypofunction: Secondary | ICD-10-CM | POA: Diagnosis not present

## 2014-08-10 DIAGNOSIS — E785 Hyperlipidemia, unspecified: Secondary | ICD-10-CM | POA: Diagnosis not present

## 2014-09-07 DIAGNOSIS — H4011X0 Primary open-angle glaucoma, stage unspecified: Secondary | ICD-10-CM | POA: Diagnosis not present

## 2014-10-22 ENCOUNTER — Ambulatory Visit
Admission: RE | Admit: 2014-10-22 | Discharge: 2014-10-22 | Disposition: A | Discharge status: Home / Self Care | Payer: Medicare Other | Source: Ambulatory Visit | Attending: Family Medicine | Admitting: Family Medicine

## 2014-10-22 ENCOUNTER — Other Ambulatory Visit: Payer: Self-pay | Admitting: Family Medicine

## 2014-10-22 DIAGNOSIS — R05 Cough: Secondary | ICD-10-CM

## 2014-10-22 DIAGNOSIS — J302 Other seasonal allergic rhinitis: Secondary | ICD-10-CM | POA: Diagnosis not present

## 2014-10-22 DIAGNOSIS — R053 Chronic cough: Secondary | ICD-10-CM

## 2014-12-13 DIAGNOSIS — H4011X2 Primary open-angle glaucoma, moderate stage: Secondary | ICD-10-CM | POA: Diagnosis not present

## 2014-12-25 DIAGNOSIS — S61201A Unspecified open wound of left index finger without damage to nail, initial encounter: Secondary | ICD-10-CM | POA: Diagnosis not present

## 2014-12-31 DIAGNOSIS — L0390 Cellulitis, unspecified: Secondary | ICD-10-CM | POA: Diagnosis not present

## 2014-12-31 DIAGNOSIS — S61201D Unspecified open wound of left index finger without damage to nail, subsequent encounter: Secondary | ICD-10-CM | POA: Diagnosis not present

## 2015-05-11 DIAGNOSIS — H4011X3 Primary open-angle glaucoma, severe stage: Secondary | ICD-10-CM | POA: Diagnosis not present

## 2015-09-22 DIAGNOSIS — Z0000 Encounter for general adult medical examination without abnormal findings: Secondary | ICD-10-CM | POA: Diagnosis not present

## 2015-09-22 DIAGNOSIS — Z23 Encounter for immunization: Secondary | ICD-10-CM | POA: Diagnosis not present

## 2015-09-22 DIAGNOSIS — E785 Hyperlipidemia, unspecified: Secondary | ICD-10-CM | POA: Diagnosis not present

## 2015-09-22 DIAGNOSIS — J309 Allergic rhinitis, unspecified: Secondary | ICD-10-CM | POA: Diagnosis not present

## 2015-09-22 DIAGNOSIS — G479 Sleep disorder, unspecified: Secondary | ICD-10-CM | POA: Diagnosis not present

## 2015-09-22 DIAGNOSIS — I10 Essential (primary) hypertension: Secondary | ICD-10-CM | POA: Diagnosis not present

## 2016-06-01 DIAGNOSIS — H401113 Primary open-angle glaucoma, right eye, severe stage: Secondary | ICD-10-CM | POA: Diagnosis not present

## 2016-10-04 DIAGNOSIS — H40013 Open angle with borderline findings, low risk, bilateral: Secondary | ICD-10-CM | POA: Diagnosis not present

## 2016-12-19 DIAGNOSIS — R05 Cough: Secondary | ICD-10-CM | POA: Diagnosis not present

## 2016-12-19 DIAGNOSIS — Z23 Encounter for immunization: Secondary | ICD-10-CM | POA: Diagnosis not present

## 2017-01-03 DIAGNOSIS — H52221 Regular astigmatism, right eye: Secondary | ICD-10-CM | POA: Diagnosis not present

## 2017-01-03 DIAGNOSIS — H35371 Puckering of macula, right eye: Secondary | ICD-10-CM | POA: Diagnosis not present

## 2017-01-03 DIAGNOSIS — H524 Presbyopia: Secondary | ICD-10-CM | POA: Diagnosis not present

## 2017-01-03 DIAGNOSIS — H401121 Primary open-angle glaucoma, left eye, mild stage: Secondary | ICD-10-CM | POA: Diagnosis not present

## 2017-01-03 DIAGNOSIS — H401113 Primary open-angle glaucoma, right eye, severe stage: Secondary | ICD-10-CM | POA: Diagnosis not present

## 2017-01-03 DIAGNOSIS — L718 Other rosacea: Secondary | ICD-10-CM | POA: Diagnosis not present

## 2017-01-03 DIAGNOSIS — H5213 Myopia, bilateral: Secondary | ICD-10-CM | POA: Diagnosis not present

## 2017-01-03 DIAGNOSIS — Z961 Presence of intraocular lens: Secondary | ICD-10-CM | POA: Diagnosis not present

## 2017-01-17 DIAGNOSIS — H401113 Primary open-angle glaucoma, right eye, severe stage: Secondary | ICD-10-CM | POA: Diagnosis not present

## 2017-02-14 DIAGNOSIS — H524 Presbyopia: Secondary | ICD-10-CM | POA: Diagnosis not present

## 2017-02-14 DIAGNOSIS — H52221 Regular astigmatism, right eye: Secondary | ICD-10-CM | POA: Diagnosis not present

## 2017-02-14 DIAGNOSIS — H401121 Primary open-angle glaucoma, left eye, mild stage: Secondary | ICD-10-CM | POA: Diagnosis not present

## 2017-02-14 DIAGNOSIS — Z961 Presence of intraocular lens: Secondary | ICD-10-CM | POA: Diagnosis not present

## 2017-02-14 DIAGNOSIS — L718 Other rosacea: Secondary | ICD-10-CM | POA: Diagnosis not present

## 2017-02-14 DIAGNOSIS — H5213 Myopia, bilateral: Secondary | ICD-10-CM | POA: Diagnosis not present

## 2017-02-14 DIAGNOSIS — H401113 Primary open-angle glaucoma, right eye, severe stage: Secondary | ICD-10-CM | POA: Diagnosis not present

## 2017-02-14 DIAGNOSIS — H35371 Puckering of macula, right eye: Secondary | ICD-10-CM | POA: Diagnosis not present

## 2017-02-15 DIAGNOSIS — J209 Acute bronchitis, unspecified: Secondary | ICD-10-CM | POA: Diagnosis not present

## 2017-06-07 DIAGNOSIS — H401113 Primary open-angle glaucoma, right eye, severe stage: Secondary | ICD-10-CM | POA: Diagnosis not present

## 2017-06-07 DIAGNOSIS — H35371 Puckering of macula, right eye: Secondary | ICD-10-CM | POA: Diagnosis not present

## 2017-06-07 DIAGNOSIS — H52221 Regular astigmatism, right eye: Secondary | ICD-10-CM | POA: Diagnosis not present

## 2017-06-07 DIAGNOSIS — H5213 Myopia, bilateral: Secondary | ICD-10-CM | POA: Diagnosis not present

## 2017-06-07 DIAGNOSIS — H401121 Primary open-angle glaucoma, left eye, mild stage: Secondary | ICD-10-CM | POA: Diagnosis not present

## 2017-06-07 DIAGNOSIS — Z961 Presence of intraocular lens: Secondary | ICD-10-CM | POA: Diagnosis not present

## 2017-06-07 DIAGNOSIS — H524 Presbyopia: Secondary | ICD-10-CM | POA: Diagnosis not present

## 2017-06-07 DIAGNOSIS — L718 Other rosacea: Secondary | ICD-10-CM | POA: Diagnosis not present

## 2017-07-30 DIAGNOSIS — H52221 Regular astigmatism, right eye: Secondary | ICD-10-CM | POA: Diagnosis not present

## 2017-07-30 DIAGNOSIS — Z961 Presence of intraocular lens: Secondary | ICD-10-CM | POA: Diagnosis not present

## 2017-07-30 DIAGNOSIS — H401113 Primary open-angle glaucoma, right eye, severe stage: Secondary | ICD-10-CM | POA: Diagnosis not present

## 2017-07-30 DIAGNOSIS — H35371 Puckering of macula, right eye: Secondary | ICD-10-CM | POA: Diagnosis not present

## 2017-07-30 DIAGNOSIS — I10 Essential (primary) hypertension: Secondary | ICD-10-CM | POA: Diagnosis not present

## 2017-07-30 DIAGNOSIS — H5213 Myopia, bilateral: Secondary | ICD-10-CM | POA: Diagnosis not present

## 2017-07-30 DIAGNOSIS — L718 Other rosacea: Secondary | ICD-10-CM | POA: Diagnosis not present

## 2017-07-30 DIAGNOSIS — H524 Presbyopia: Secondary | ICD-10-CM | POA: Diagnosis not present

## 2017-07-30 DIAGNOSIS — H401121 Primary open-angle glaucoma, left eye, mild stage: Secondary | ICD-10-CM | POA: Diagnosis not present

## 2017-09-24 DIAGNOSIS — H52221 Regular astigmatism, right eye: Secondary | ICD-10-CM | POA: Diagnosis not present

## 2017-09-24 DIAGNOSIS — H35371 Puckering of macula, right eye: Secondary | ICD-10-CM | POA: Diagnosis not present

## 2017-09-24 DIAGNOSIS — L718 Other rosacea: Secondary | ICD-10-CM | POA: Diagnosis not present

## 2017-09-24 DIAGNOSIS — Z961 Presence of intraocular lens: Secondary | ICD-10-CM | POA: Diagnosis not present

## 2017-09-24 DIAGNOSIS — I10 Essential (primary) hypertension: Secondary | ICD-10-CM | POA: Diagnosis not present

## 2017-09-24 DIAGNOSIS — H401113 Primary open-angle glaucoma, right eye, severe stage: Secondary | ICD-10-CM | POA: Diagnosis not present

## 2017-09-24 DIAGNOSIS — H524 Presbyopia: Secondary | ICD-10-CM | POA: Diagnosis not present

## 2017-09-24 DIAGNOSIS — H401121 Primary open-angle glaucoma, left eye, mild stage: Secondary | ICD-10-CM | POA: Diagnosis not present

## 2017-09-24 DIAGNOSIS — H5213 Myopia, bilateral: Secondary | ICD-10-CM | POA: Diagnosis not present

## 2017-09-26 DIAGNOSIS — L821 Other seborrheic keratosis: Secondary | ICD-10-CM | POA: Diagnosis not present

## 2017-10-24 DIAGNOSIS — Z23 Encounter for immunization: Secondary | ICD-10-CM | POA: Diagnosis not present

## 2017-10-24 DIAGNOSIS — Z0000 Encounter for general adult medical examination without abnormal findings: Secondary | ICD-10-CM | POA: Diagnosis not present

## 2017-10-24 DIAGNOSIS — I10 Essential (primary) hypertension: Secondary | ICD-10-CM | POA: Diagnosis not present

## 2017-10-24 DIAGNOSIS — R399 Unspecified symptoms and signs involving the genitourinary system: Secondary | ICD-10-CM | POA: Diagnosis not present

## 2017-10-24 DIAGNOSIS — R351 Nocturia: Secondary | ICD-10-CM | POA: Diagnosis not present

## 2017-10-24 DIAGNOSIS — Z1211 Encounter for screening for malignant neoplasm of colon: Secondary | ICD-10-CM | POA: Diagnosis not present

## 2017-10-25 DIAGNOSIS — I10 Essential (primary) hypertension: Secondary | ICD-10-CM | POA: Diagnosis not present

## 2017-10-25 DIAGNOSIS — R351 Nocturia: Secondary | ICD-10-CM | POA: Diagnosis not present

## 2017-10-25 DIAGNOSIS — R399 Unspecified symptoms and signs involving the genitourinary system: Secondary | ICD-10-CM | POA: Diagnosis not present

## 2018-01-28 DIAGNOSIS — H401113 Primary open-angle glaucoma, right eye, severe stage: Secondary | ICD-10-CM | POA: Diagnosis not present

## 2018-01-28 DIAGNOSIS — H524 Presbyopia: Secondary | ICD-10-CM | POA: Diagnosis not present

## 2018-05-05 DIAGNOSIS — R350 Frequency of micturition: Secondary | ICD-10-CM | POA: Diagnosis not present

## 2018-05-05 DIAGNOSIS — E1165 Type 2 diabetes mellitus with hyperglycemia: Secondary | ICD-10-CM | POA: Diagnosis not present

## 2018-05-05 DIAGNOSIS — R631 Polydipsia: Secondary | ICD-10-CM | POA: Diagnosis not present

## 2018-05-05 DIAGNOSIS — R5383 Other fatigue: Secondary | ICD-10-CM | POA: Diagnosis not present

## 2018-05-05 DIAGNOSIS — R739 Hyperglycemia, unspecified: Secondary | ICD-10-CM | POA: Diagnosis not present

## 2018-05-05 DIAGNOSIS — R81 Glycosuria: Secondary | ICD-10-CM | POA: Diagnosis not present

## 2018-05-29 DIAGNOSIS — E1165 Type 2 diabetes mellitus with hyperglycemia: Secondary | ICD-10-CM | POA: Diagnosis not present

## 2018-05-29 DIAGNOSIS — E785 Hyperlipidemia, unspecified: Secondary | ICD-10-CM | POA: Diagnosis not present

## 2018-05-29 DIAGNOSIS — E1169 Type 2 diabetes mellitus with other specified complication: Secondary | ICD-10-CM | POA: Diagnosis not present

## 2018-06-19 DIAGNOSIS — E1169 Type 2 diabetes mellitus with other specified complication: Secondary | ICD-10-CM | POA: Diagnosis not present

## 2018-06-19 DIAGNOSIS — E785 Hyperlipidemia, unspecified: Secondary | ICD-10-CM | POA: Diagnosis not present

## 2018-06-19 DIAGNOSIS — Z713 Dietary counseling and surveillance: Secondary | ICD-10-CM | POA: Diagnosis not present

## 2018-08-04 DIAGNOSIS — H401113 Primary open-angle glaucoma, right eye, severe stage: Secondary | ICD-10-CM | POA: Diagnosis not present

## 2018-11-13 DIAGNOSIS — E1165 Type 2 diabetes mellitus with hyperglycemia: Secondary | ICD-10-CM | POA: Diagnosis not present

## 2018-11-13 DIAGNOSIS — E1169 Type 2 diabetes mellitus with other specified complication: Secondary | ICD-10-CM | POA: Diagnosis not present

## 2018-11-13 DIAGNOSIS — E785 Hyperlipidemia, unspecified: Secondary | ICD-10-CM | POA: Diagnosis not present

## 2018-11-13 DIAGNOSIS — Z23 Encounter for immunization: Secondary | ICD-10-CM | POA: Diagnosis not present

## 2018-11-13 DIAGNOSIS — I10 Essential (primary) hypertension: Secondary | ICD-10-CM | POA: Diagnosis not present

## 2018-11-13 DIAGNOSIS — Z1159 Encounter for screening for other viral diseases: Secondary | ICD-10-CM | POA: Diagnosis not present

## 2018-11-14 DIAGNOSIS — Z1159 Encounter for screening for other viral diseases: Secondary | ICD-10-CM | POA: Diagnosis not present

## 2018-11-14 DIAGNOSIS — E1165 Type 2 diabetes mellitus with hyperglycemia: Secondary | ICD-10-CM | POA: Diagnosis not present

## 2018-11-24 DIAGNOSIS — H401113 Primary open-angle glaucoma, right eye, severe stage: Secondary | ICD-10-CM | POA: Diagnosis not present
